# Patient Record
Sex: Female | Born: 1965 | Race: White | Hispanic: No | Marital: Married
Health system: Southern US, Community
[De-identification: ages and names within clinical notes are randomized; demographics above are authoritative.]

---

## 1998-03-07 ENCOUNTER — Emergency Department (HOSPITAL_COMMUNITY): Admission: EM | Admit: 1998-03-07 | Discharge: 1998-03-07 | Payer: Self-pay | Admitting: Emergency Medicine

## 1998-03-08 ENCOUNTER — Ambulatory Visit (HOSPITAL_COMMUNITY): Admission: RE | Admit: 1998-03-08 | Discharge: 1998-03-08 | Payer: Self-pay | Admitting: Emergency Medicine

## 1998-03-18 ENCOUNTER — Inpatient Hospital Stay (HOSPITAL_COMMUNITY): Admission: RE | Admit: 1998-03-18 | Discharge: 1998-03-19 | Payer: Self-pay

## 1999-03-20 ENCOUNTER — Inpatient Hospital Stay (HOSPITAL_COMMUNITY): Admission: EM | Admit: 1999-03-20 | Discharge: 1999-03-26 | Payer: Self-pay | Admitting: Emergency Medicine

## 1999-11-09 ENCOUNTER — Other Ambulatory Visit: Admission: RE | Admit: 1999-11-09 | Discharge: 1999-11-28 | Payer: Self-pay | Admitting: *Deleted

## 1999-12-25 ENCOUNTER — Other Ambulatory Visit (HOSPITAL_COMMUNITY): Admission: RE | Admit: 1999-12-25 | Discharge: 2000-01-03 | Payer: Self-pay | Admitting: Psychiatry

## 2000-01-22 ENCOUNTER — Inpatient Hospital Stay (HOSPITAL_COMMUNITY): Admission: EM | Admit: 2000-01-22 | Discharge: 2000-01-23 | Payer: Self-pay | Admitting: Psychiatry

## 2000-06-24 ENCOUNTER — Other Ambulatory Visit: Admission: RE | Admit: 2000-06-24 | Discharge: 2000-06-24 | Payer: Self-pay | Admitting: Gynecology

## 2000-07-03 ENCOUNTER — Encounter: Admission: RE | Admit: 2000-07-03 | Discharge: 2000-07-03 | Payer: Self-pay | Admitting: Gynecology

## 2000-07-03 ENCOUNTER — Encounter: Payer: Self-pay | Admitting: Gynecology

## 2001-08-21 ENCOUNTER — Encounter: Admission: RE | Admit: 2001-08-21 | Discharge: 2001-11-19 | Payer: Self-pay

## 2001-11-26 ENCOUNTER — Encounter: Admission: RE | Admit: 2001-11-26 | Discharge: 2002-02-24 | Payer: Self-pay

## 2002-06-15 ENCOUNTER — Emergency Department (HOSPITAL_COMMUNITY): Admission: EM | Admit: 2002-06-15 | Discharge: 2002-06-15 | Payer: Self-pay | Admitting: Emergency Medicine

## 2002-09-22 ENCOUNTER — Emergency Department (HOSPITAL_COMMUNITY): Admission: EM | Admit: 2002-09-22 | Discharge: 2002-09-23 | Payer: Self-pay

## 2002-09-22 ENCOUNTER — Encounter: Payer: Self-pay | Admitting: *Deleted

## 2003-06-19 ENCOUNTER — Encounter: Payer: Self-pay | Admitting: Emergency Medicine

## 2003-06-19 ENCOUNTER — Emergency Department (HOSPITAL_COMMUNITY): Admission: EM | Admit: 2003-06-19 | Discharge: 2003-06-19 | Payer: Self-pay | Admitting: Emergency Medicine

## 2003-07-20 ENCOUNTER — Ambulatory Visit (HOSPITAL_COMMUNITY): Admission: RE | Admit: 2003-07-20 | Discharge: 2003-07-20 | Payer: Self-pay | Admitting: Internal Medicine

## 2004-03-05 ENCOUNTER — Emergency Department (HOSPITAL_COMMUNITY): Admission: EM | Admit: 2004-03-05 | Discharge: 2004-03-05 | Payer: Self-pay | Admitting: Emergency Medicine

## 2004-08-20 ENCOUNTER — Ambulatory Visit: Payer: Self-pay | Admitting: Internal Medicine

## 2004-11-08 ENCOUNTER — Inpatient Hospital Stay (HOSPITAL_COMMUNITY): Admission: EM | Admit: 2004-11-08 | Discharge: 2004-11-09 | Payer: Self-pay | Admitting: Emergency Medicine

## 2004-11-08 ENCOUNTER — Ambulatory Visit: Payer: Self-pay | Admitting: Internal Medicine

## 2004-11-14 ENCOUNTER — Ambulatory Visit: Payer: Self-pay | Admitting: Internal Medicine

## 2005-03-12 ENCOUNTER — Emergency Department: Payer: Self-pay | Admitting: Emergency Medicine

## 2005-09-17 ENCOUNTER — Emergency Department (HOSPITAL_COMMUNITY): Admission: EM | Admit: 2005-09-17 | Discharge: 2005-09-17 | Payer: Self-pay | Admitting: Emergency Medicine

## 2007-02-18 ENCOUNTER — Encounter: Admission: RE | Admit: 2007-02-18 | Discharge: 2007-02-18 | Payer: Self-pay | Admitting: Orthopedic Surgery

## 2007-02-20 ENCOUNTER — Encounter (INDEPENDENT_AMBULATORY_CARE_PROVIDER_SITE_OTHER): Payer: Self-pay | Admitting: Orthopedic Surgery

## 2007-02-20 ENCOUNTER — Inpatient Hospital Stay (HOSPITAL_COMMUNITY): Admission: RE | Admit: 2007-02-20 | Discharge: 2007-02-21 | Payer: Self-pay | Admitting: Orthopedic Surgery

## 2010-12-24 ENCOUNTER — Emergency Department: Payer: Self-pay | Admitting: Internal Medicine

## 2011-01-22 NOTE — Discharge Summary (Signed)
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  The day of admission, she was taken to the  operating room where an anterior decompression and fusion was carried  out without events.  Interbody cage with left anterior iliac crest  graft, titanium plate and screws were used.   This morning, the first postoperative day, I removed the Penrose drain.  The incision looks fine.  The iliac crest dressing on the left side is  dry.  The patient is ambulatory.  She has a good grip bilaterally.  She  has good lower extremity strength.  She has minimal hoarseness and a  sore throat.   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   DISCUSSION:  At the present time, she is being discharged.  She will be  followed in the office in about 10-12 days.  She is to remove her iliac  crest dressing in 2 days and she may shower after that and leave the  dressings off.  She is wanting to be discharged with a soft collar, and  will get an Aspen collar on Monday, the 16th of June.   Her left brachialgia has resolved at this time.  She has been cautioned  to take a soft diet for 1 week.  She has Norco 10 at home, which she  will use for pain control.   She is considered stable with improved left brachialgia at the present  time.      Nelda Severe, MD  Electronically Signed     MT/MEDQ  D:  02/21/2007  T:  02/21/2007  Job:  [id number]

## 2011-01-22 NOTE — Op Note (Signed)
NAME:  Marilyn Lambert, HUNDAL NO.:  [id number]   MEDICAL RECORD NO.:  [id number]          PATIENT TYPE:  INP   LOCATION:  5025                         FACILITY:  [facility]   PHYSICIAN:  Nelda Severe, MD      DATE OF BIRTH:  03-Nov-1965   DATE OF PROCEDURE:  02/20/2007  DATE OF DISCHARGE:                               OPERATIVE REPORT   SURGEON:  Dr. Nelda Severe.   ASSISTANT:  Lianne Cure, PA-C   PREOPERATIVE DIAGNOSIS:  A C5-C6 disc herniation with biforaminal  stenosis status post remote C6-C7 fusion.   POSTOPERATIVE DIAGNOSIS:  A C5-C6 disc herniation with biforaminal  stenosis status post remote C6-C7 fusion.   INDICATIONS FOR SURGERY:  Chronic left brachialgia.   OPERATIVE NOTE:  The patient was placed under general endotracheal  anesthesia.  Sequential compression devices were placed on both lower  extremities.  A Foley catheter was not placed in the bladder.  She was  positioned supine on the operating table with a bump under the shoulders  so that the neck could be extended.  The head was supported on foam.  The arms were tucked at the sides.  The hips and knees were gently  flexed.  The proposed incision site at the left iliac crest for graft  harvest was marked with a skin marker.  The previous C6-C7 anterior  cervical fusion was marked with a skin marker.  A needle was taped to  the skin on the left side at a level above the previous incision, and a  cross-table lateral radiograph taken which showed the needle to be on  the skin at about the C4-C5 level.  The position of the needle was  marked with a skin marker also.  The needle was removed.  Next the  anterior cervical area and left anterior iliac crest area were prepped  with DuraPrep.  They were draped with square towels, and the towels  secured with Ioban.  Then a sheet was used to drape the incisions out.   A transverse incision was made from the midline laterally to the  anterior border of  sternocleidomastoid at a point between the position  of the previous incision and that where the needle had been taped on the  skin.  The deep fascial interval between the sternocleidomastoid and the  strap muscles was developed bluntly.  The carotid sheath was retracted  laterally, and the esophagus and trachea retracted to the right side.  The longus coli muscles were identified.  A cross-table lateral  radiograph was taken with a needle placed in the C5-C6 disc space to  confirm level.  The longus coli muscles were then mobilized bilaterally.  A Shadow-Line retractor was then placed and retracted side to side.  We  then placed an Aesculap Caspar distraction pin in C5 above and C6 below  and applied the distractor.  The C5-C6 disc was incised, and then  curettes and pituitary rongeurs were used to enucleate it.  A further  curettage removed cartilage from the endplates.  A little more  distraction was applied.  It was not necessary to  use a high-speed bur.  On the left side, there was a breech in the posterior longitudinal  ligament where the disc had apparently extruded beyond the confines of  the posterior longitudinal ligament.  In the end, we decompressed all  the way across the canal to the dura.  Disc was removed from as far  laterally as possible, and both foramina palpated with a nerve hook and  felt to be well decompressed.   We then used a sizer to determine the thickness of interbody fusion  device (Theken) that we would use.  This was the thinnest, 6 mm.   Next we harvested a left iliac crest bone graft through a short incision  made over the iliac crest about 2 inches posterior to the anterior  superior spine.  The fascia was cleared off the top of the iliac crest  and cortical bone removed with a Leksell rongeur.  A disposable bone  harvesting device was then used to harvest several small cores of bone,  which were sufficient to fill the chambers in the interbody fusion   device.   The interbody fusion device was then placed between the vertebral  bodies, and the distraction device was removed.  An appropriate-length  titanium plate was then attached anteriorly at C5-C6 with four screws.  A cross-table lateral radiograph was taken, and the radiopaque marker in  the interbody fusion device was at the posterior edge of the vertebral  bodies.  I felt that a more ideal position would be for it to be a  little more anteriorly.  Therefore the plate was removed.  Some of the  inferior lip anteriorly at C5 was removed with a high-speed bur and a  small osteotome, and the device pulled anteriorly 1 or 2 mm.  The plate  was then reattached and another x-ray taken which showed absolutely no  change in position of the cage.  I did not feel that the position was  unacceptable, and I did not want to go to a smaller footprint cage which  would contain less graft.  Therefore no further adjustment was made.   A 1/4-inch Penrose drain was placed in the depths of the anterior  cervical wound after irrigating the wound with sterile saline.  The  platysma was closed using interrupted 2-0 undyed Vicryl suture.  The  Penrose drain was secured with one 2-0 nylon suture.  The skin was  closed using 3-0 undyed Vicryl in a subcuticular fashion.  The skin  edges were reapposed with Steri-Strips.   In the iliac crest wound, we had injected FloSeal into the defect to  stop bleeding, and there was no bleeding towards the end of the  procedure from the iliac crest wound.  The external oblique fascia was  reapposed with a single figure-of-eight #0 Vicryl suture.  The  subcutaneous tissue was closed using 2-0 Vicryl suture, and the skin was  closed using 3-0 undyed Vicryl in a subcuticular fashion.  The skin  edges of both wounds were reinforced with Steri-Strips.  Nonadherent  antibiotic dressing was applied to the iliac crest wound.  A gauze  dressing was applied and secured with a  towel to the cervical spine  dressing.  The patient was taken off the table, awakened, and taken to  the recovery room.  In the recovery room, she could move all extremities  and had satisfactory grip strength bilaterally.   There were no intraoperative complications.  The blood loss was  negligible.  Sponge and needle counts were correct.      Nelda Severe, MD  Electronically Signed     MT/MEDQ  D:  02/21/2007  T:  02/22/2007  Job:  [id number]
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
## 2011-01-25 NOTE — Consult Note (Signed)
[facility]  Patient:    Marilyn Lambert, VILLAMAR Visit Number: [id number] MRN: [id number]          Service Type: Attending:  Sondra Come, D.O. Dictated by:   Sondra Come, D.O. Proc. Date: 02/12/02                            Consultation Report  Ms. Nickell returns to clinic today for reevaluation.  She was last seen on December 03, 2001 for degenerative disk disease of the cervical spine with persistent left upper extremity radicular symptoms status post C6-7 fusion.  I started her on methadone 5 mg t.i.d. at that time.  She had called our clinic following that stated that the 5 mg methadone was not helping.  I had given instructions to have her increase to two 5 mg pills b.i.d. which she states did not seem to help her pain significantly, but caused her some nausea and she was unable to tolerate the methadone.  She did not come back to clinic for reevaluation at her scheduled time secondary to financial reasons.  She returns today complaining of severe pain in her neck and left upper back and left upper extremity.  She states that approximately two weeks ago she turned her neck and heard a pop in her neck like she did prior to her first disk herniation and feels grinding in her neck with increased pain in her left upper extremity involving her posterior arm, entire forearm with associated numbness and paraesthesias in her left small finger which remain constant. She also describes dropping objects.  I review the health and history form and 14 point review of systems.  Patients pain today is an 8/10 on a subjective scale.  She describes this as achy, throbbing, sharp, stabbing with associated numbness, tingling, and weakness.  She has difficulty sleeping.  She is not currently taking any medications primarily secondary to financial reasons as the patient has lost her medical insurance because her husband was fired from his job and they are getting  divorced.  PHYSICAL EXAMINATION  GENERAL:  Healthy female in no acute distress.  VITAL SIGNS:  Blood pressure 133/78, pulse 108, respirations 16, O2 saturation 98% on room air.  NEUROLOGIC:  Manual muscle testing is 5/5 bilateral upper extremities statically.  Sensory examination reveals decreased light touch in the left small finger.  Muscle stretch reflexes are symmetric bilaterally in the upper extremities.  Upper limb tension test is equivocal on the left.  Spurling is equivocal on the left, but causes a stabbing sensation in the patients left upper back.  IMPRESSION: 1. Cervicalgia with left upper extremity radicular symptoms. 2. Degenerative disk disease of the cervical spine, status post C6-7 fusion. 3. Myofascial component. 4. Nonrestorative sleep disorder.  PLAN: 1. Will start zonegran 100 mg q.d. #14 samples given. 2. Will start Celebrex 200 mg one p.o. b.i.d. #30 samples given.  Also,    supplied patient with Celebrex pain pack to start with. 3. Will give samples of Ultracet one to two up to q.i.d. p.r.n. for pain #20    supplied. 4. Patient to return to clinic in two weeks for reevaluation.  If symptoms are    not improving, will consider increasing zonegran to 200 mg daily.  Will    also consider repeat trigger point injections to the left upper back and    parascapular muscles which were beneficial in the past. 5. If symptoms are not  improving with conservative measures, consider repeat    MRI of the cervical spine.  Patient was educated on the above findings and recommendations and understands.  There were no barriers to communication. Dictated by:   Sondra Come, D.O. Attending:  Sondra Come, D.O. DD:  02/12/02 TD:  02/15/02 Job: [id number]

## 2011-04-15 ENCOUNTER — Emergency Department: Payer: Self-pay | Admitting: Emergency Medicine

## 2011-06-27 LAB — URINALYSIS, ROUTINE W REFLEX MICROSCOPIC
Glucose, UA: NEGATIVE
Leukocytes, UA: NEGATIVE
Nitrite: NEGATIVE
Specific Gravity, Urine: 1.029 (ref 1.005–1.035)
pH: 5.5

## 2011-06-27 LAB — BASIC METABOLIC PANEL
CO2: 27
Chloride: 109
GFR calc Af Amer: >60
Potassium: 3.7
Sodium: 140

## 2011-06-27 LAB — COMPREHENSIVE METABOLIC PANEL
AST: 22
Albumin: 3.8
Alkaline Phosphatase: 75
BUN: 4 — ABNORMAL LOW
Chloride: 101
GFR calc Af Amer: >60
Potassium: 4
Sodium: 136
Total Protein: 7.1

## 2011-06-27 LAB — DIFFERENTIAL
Basophils Relative: 0
Eosinophils Relative: 1
Monocytes Absolute: 0.9 — ABNORMAL HIGH
Monocytes Relative: 9
Neutro Abs: 5.3

## 2011-06-27 LAB — CBC
HCT: 35.2 — ABNORMAL LOW
HCT: 40.7
Hemoglobin: 11.9 — ABNORMAL LOW
MCHC: 33.8
MCV: 92
Platelets: 408 — ABNORMAL HIGH
RBC: 3.83 — ABNORMAL LOW
RDW: 12.2
WBC: 9.4

## 2011-06-27 LAB — URINE CULTURE

## 2011-06-27 LAB — APTT: aPTT: 36

## 2011-07-02 ENCOUNTER — Emergency Department (HOSPITAL_COMMUNITY)
Admission: EM | Admit: 2011-07-02 | Discharge: 2011-07-03 | Disposition: A | Discharge status: Home / Self Care | Payer: Medicare Other | Attending: Emergency Medicine | Admitting: Emergency Medicine

## 2011-07-02 ENCOUNTER — Emergency Department (HOSPITAL_COMMUNITY): Payer: Medicare Other

## 2011-07-02 DIAGNOSIS — M25529 Pain in unspecified elbow: Secondary | ICD-10-CM | POA: Insufficient documentation

## 2011-07-02 DIAGNOSIS — T43502A Poisoning by unspecified antipsychotics and neuroleptics, intentional self-harm, initial encounter: Secondary | ICD-10-CM | POA: Insufficient documentation

## 2011-07-02 DIAGNOSIS — T424X4A Poisoning by benzodiazepines, undetermined, initial encounter: Secondary | ICD-10-CM | POA: Insufficient documentation

## 2011-07-02 DIAGNOSIS — F319 Bipolar disorder, unspecified: Secondary | ICD-10-CM | POA: Insufficient documentation

## 2011-07-02 DIAGNOSIS — T5191XA Toxic effect of unspecified alcohol, accidental (unintentional), initial encounter: Secondary | ICD-10-CM | POA: Insufficient documentation

## 2011-07-02 DIAGNOSIS — I951 Orthostatic hypotension: Secondary | ICD-10-CM | POA: Insufficient documentation

## 2011-07-02 DIAGNOSIS — F411 Generalized anxiety disorder: Secondary | ICD-10-CM | POA: Insufficient documentation

## 2011-07-02 DIAGNOSIS — T426X1A Poisoning by other antiepileptic and sedative-hypnotic drugs, accidental (unintentional), initial encounter: Secondary | ICD-10-CM | POA: Insufficient documentation

## 2011-07-02 DIAGNOSIS — T4272XA Poisoning by unspecified antiepileptic and sedative-hypnotic drugs, intentional self-harm, initial encounter: Secondary | ICD-10-CM | POA: Insufficient documentation

## 2011-07-02 DIAGNOSIS — T426X2A Poisoning by other antiepileptic and sedative-hypnotic drugs, intentional self-harm, initial encounter: Secondary | ICD-10-CM | POA: Insufficient documentation

## 2011-07-02 DIAGNOSIS — T6592XA Toxic effect of unspecified substance, intentional self-harm, initial encounter: Secondary | ICD-10-CM | POA: Insufficient documentation

## 2011-07-02 LAB — CBC
MCH: 31.8 pg (ref 26.0–34.0)
MCV: 91.8 fL (ref 78.0–100.0)
Platelets: 418 10*3/uL — ABNORMAL HIGH (ref 150–400)
RBC: 4.25 MIL/uL (ref 3.87–5.11)
RDW: 13 % (ref 11.5–15.5)

## 2011-07-02 LAB — ETHANOL: Alcohol, Ethyl (B): 94 mg/dL — ABNORMAL HIGH (ref 0–11)

## 2011-07-02 LAB — BASIC METABOLIC PANEL
CO2: 22 mEq/L (ref 19–32)
Calcium: 9.2 mg/dL (ref 8.4–10.5)
Sodium: 144 mEq/L (ref 135–145)

## 2011-07-02 LAB — RAPID URINE DRUG SCREEN, HOSP PERFORMED: Amphetamines: NOT DETECTED

## 2011-07-02 LAB — ACETAMINOPHEN LEVEL: Acetaminophen (Tylenol), Serum: <15 ug/mL (ref 10–30)

## 2011-07-02 LAB — SALICYLATE LEVEL: Salicylate Lvl: <2 mg/dL — ABNORMAL LOW (ref 2.8–20.0)

## 2014-12-21 ENCOUNTER — Other Ambulatory Visit (HOSPITAL_COMMUNITY): Payer: Self-pay | Admitting: Neurosurgery

## 2014-12-21 DIAGNOSIS — M4726 Other spondylosis with radiculopathy, lumbar region: Secondary | ICD-10-CM

## 2014-12-21 DIAGNOSIS — M4722 Other spondylosis with radiculopathy, cervical region: Secondary | ICD-10-CM

## 2014-12-28 ENCOUNTER — Ambulatory Visit (HOSPITAL_COMMUNITY)
Admission: RE | Admit: 2014-12-28 | Discharge: 2014-12-28 | Disposition: A | Discharge status: Home / Self Care | Payer: Medicare Other | Source: Ambulatory Visit | Attending: Neurosurgery | Admitting: Neurosurgery

## 2014-12-28 DIAGNOSIS — Z981 Arthrodesis status: Secondary | ICD-10-CM | POA: Insufficient documentation

## 2014-12-28 DIAGNOSIS — M4722 Other spondylosis with radiculopathy, cervical region: Secondary | ICD-10-CM

## 2014-12-28 DIAGNOSIS — I700 Atherosclerosis of aorta: Secondary | ICD-10-CM | POA: Insufficient documentation

## 2014-12-28 DIAGNOSIS — M5126 Other intervertebral disc displacement, lumbar region: Secondary | ICD-10-CM | POA: Insufficient documentation

## 2014-12-28 DIAGNOSIS — M4726 Other spondylosis with radiculopathy, lumbar region: Secondary | ICD-10-CM | POA: Diagnosis not present

## 2014-12-28 MED ORDER — OXYCODONE HCL 5 MG PO TABS
ORAL_TABLET | ORAL | Status: AC
  Filled 2014-12-28: qty 1

## 2014-12-28 MED ORDER — OXYCODONE-ACETAMINOPHEN 5-325 MG PO TABS
ORAL_TABLET | ORAL | Status: AC
  Administered 2014-12-28: 2
  Filled 2014-12-28: qty 2

## 2014-12-28 MED ORDER — ONDANSETRON HCL 4 MG/2ML IJ SOLN: 4.0000 mg | Freq: Four times a day (QID) | INTRAMUSCULAR | Status: DC | PRN

## 2014-12-28 MED ORDER — DIAZEPAM 5 MG PO TABS
ORAL_TABLET | ORAL | Status: AC
  Filled 2014-12-28: qty 2

## 2014-12-28 MED ORDER — DIAZEPAM 5 MG PO TABS
10.0000 mg | ORAL_TABLET | Freq: Once | ORAL | Status: AC
  Administered 2014-12-28: 10 mg via ORAL

## 2014-12-28 MED ORDER — OXYCODONE HCL 5 MG PO TABS
5.0000 mg | ORAL_TABLET | ORAL | Status: DC | PRN
  Administered 2014-12-28: 5 mg via ORAL

## 2014-12-28 MED ORDER — IOHEXOL 300 MG/ML  SOLN
10.0000 mL | Freq: Once | INTRAMUSCULAR | Status: AC | PRN
  Administered 2014-12-28: 10 mL via INTRATHECAL

## 2014-12-28 NOTE — Op Note (Signed)
12/28/2014 Cervical and Lumbar Myelogram  PATIENT:  Marilyn Lambert is a 49 y.o. female with radicular pain in the upper extremities, and lower extremities  PRE-OPERATIVE DIAGNOSIS:  osteoarthritis cervical and lumbar with radiculopathies  POST-OPERATIVE DIAGNOSIS:  same  PROCEDURE:  Lumbar Myelogram  SURGEON:  Anthea Udovich  ANESTHESIA:   local LOCAL MEDICATIONS USED:  LIDOCAINE  and Amount: 5 ml Procedure Note: Marilyn Lambert is a 49 y.o. female Was taken to the fluoroscopy suite and  positioned prone on the fluoroscopy table. Her back was prepared and draped in a sterile manner. I infiltrated 5 cc into the lumbar region. I then introduced a spinal needle into the thecal sac at the L3/4 interlaminar space. I infiltrated 10cc of Omnipaque 300 into the thecal sac. Fluoroscopy showed the needle and contrast in the thecal sac. Marilyn Lambert tolerated the procedure well. she Will be taken to CT for evaluation.     PATIENT DISPOSITION:  PACU - hemodynamically stable.

## 2014-12-28 NOTE — Discharge Instructions (Signed)
Myelogram and Lumbar Puncture Discharge Instructions ° °1. Go home and rest quietly for the next 24 hours.  It is important to lie flat for the next 24 hours.  Get up only to go to the restroom.  You may lie in the bed or on a couch on your back, your stomach, your left side or your right side.  You may have one pillow under your head.  You may have pillows between your knees while you are on your side or under your knees while you are on your back. ° °2. DO NOT drive today.  Recline the seat as far back as it will go, while still wearing your seat belt, on the way home. ° °3. You may get up to go to the bathroom as needed.  You may sit up for 10 minutes to eat.  You may resume your normal diet and medications unless otherwise indicated. ° °4. The incidence of headache, nausea, or vomiting is about 5% (one in 20 patients).  If you develop a headache, lie flat and drink plenty of fluids until the headache goes away.  Caffeinated beverages may be helpful.  If you develop severe nausea and vomiting or a headache that does not go away with flat bed rest, call Dr Cabbell. ° °5. You may resume normal activities after your 24 hours of bed rest is over; however, do not exert yourself strongly or do any heavy lifting tomorrow. ° °6. Call your physician for a follow-up appointment.  The results of your myelogram will be sent directly to your physician by the following day. ° °7. If you have any questions or if complications develop after you arrive home, please call Dr Cabbell. ° °Discharge instructions have been explained to the patient.  The patient, or the person responsible for the patient, fully understands these instructions. ° ° °

## 2015-05-28 IMAGING — CT CT L SPINE W/ CM
3 of 10 series · 11 of 33 positions shown, 14 images · non-contrast
Comparison: none

CLINICAL DATA: Neck pain and back pain of unspecified duration. No
reported lower extremity radicular symptoms. Weakness and numbness
extending to the LEFT arm and hand.
TECHNIQUE: Contiguous axial images were obtained through the Cervical,
Thoracic, and Lumbar spine after the intrathecal infusion of
infusion. Coronal and sagittal reconstructions were obtained of the
axial image sets.

[Series 6: l spine 2.0 i30s 3 · axial · 0.33mm/px · z∈[-785,-609]mm · 5 of 124 slices shown, 7 images]
[im 18/124  soft-tissue]
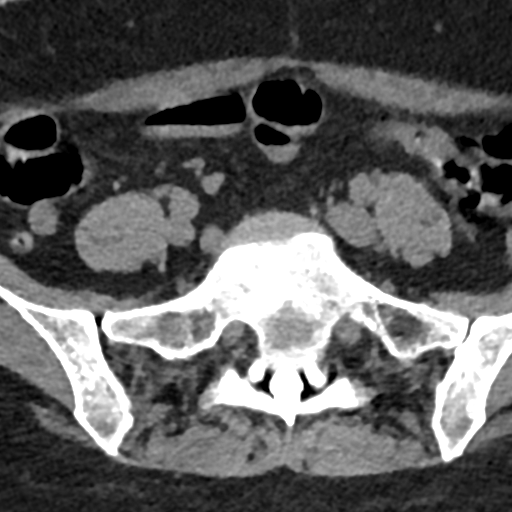
[im 18/124  bone]
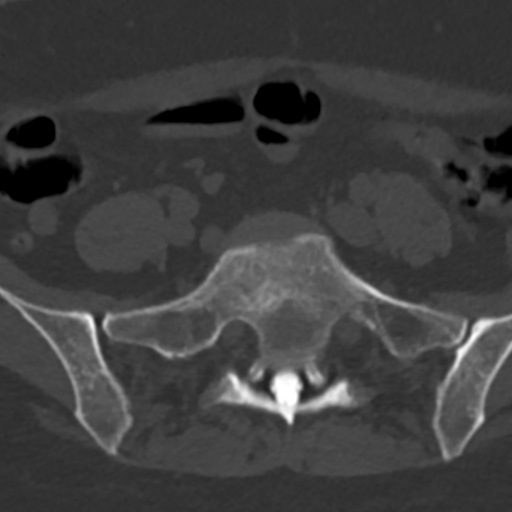
[im 36/124  bone]
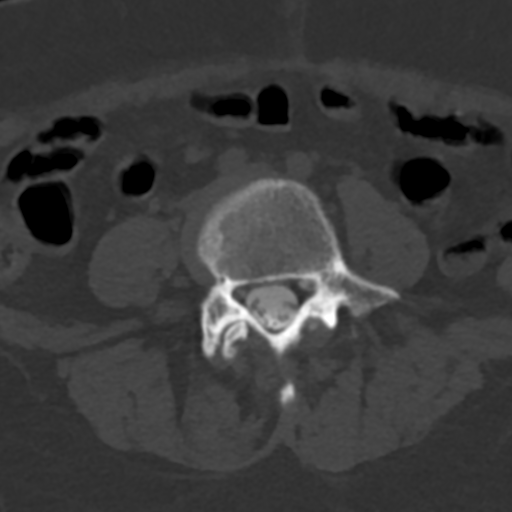
[im 71/124  bone]
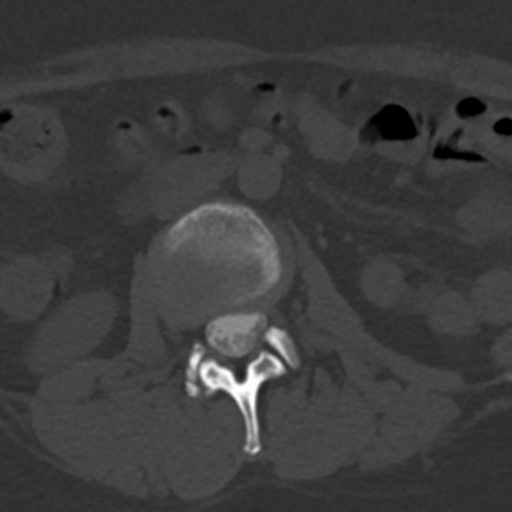
[im 88/124  bone]
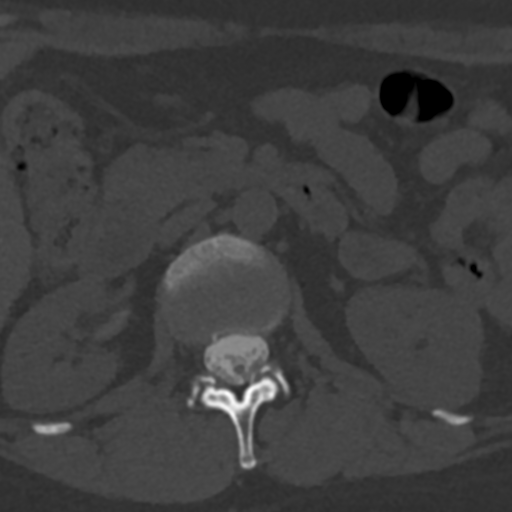
[im 106/124  soft-tissue]
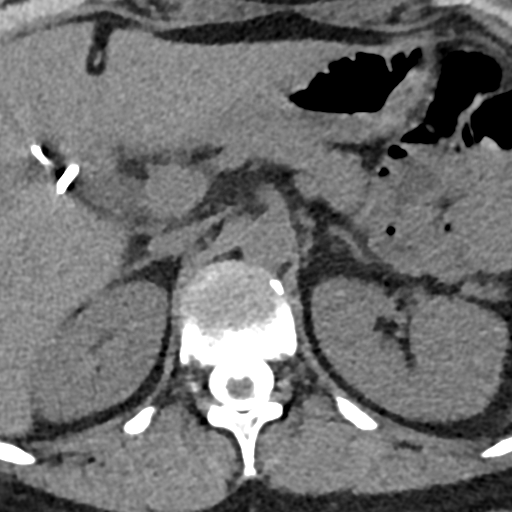
[im 106/124  bone]
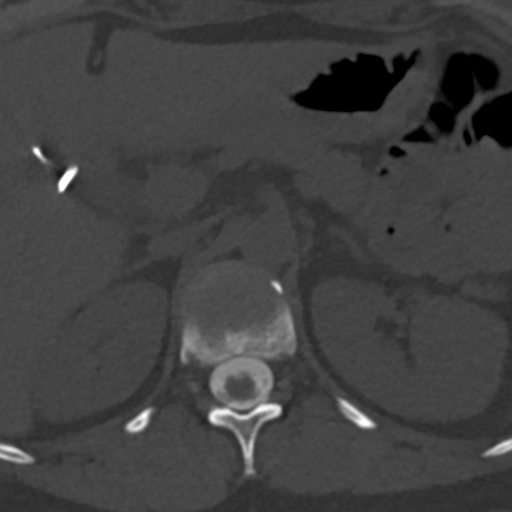

[Series 9: coronal st · coronal · 0.33mm/px · 1 of 83 slices shown]
[im 42/83  bone]
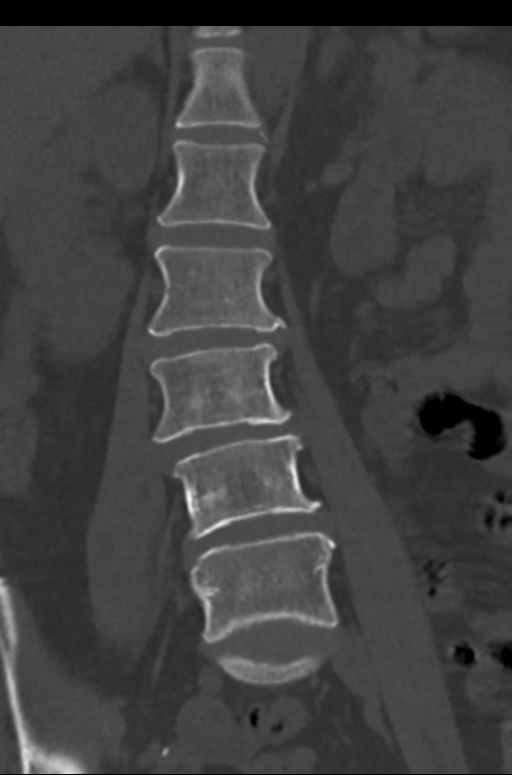

[Series 10: sagittal st · sagittal · 0.33mm/px · 5 of 72 slices shown, 6 images]
[im 24/72  bone]
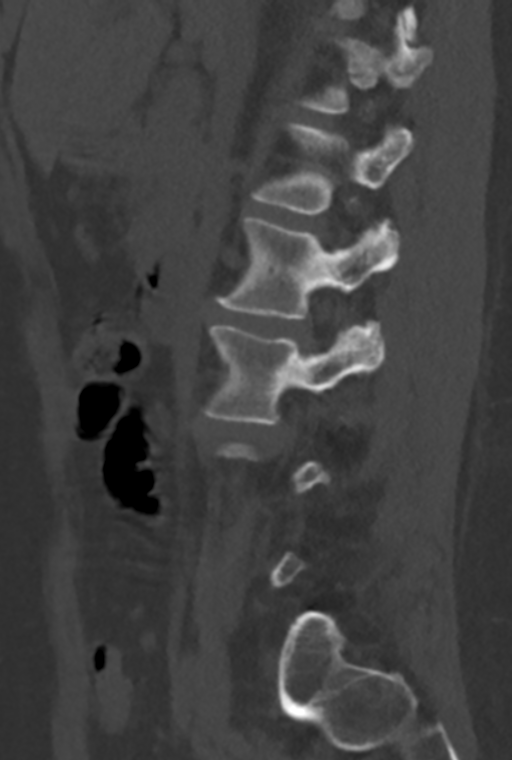
[im 30/72  bone]
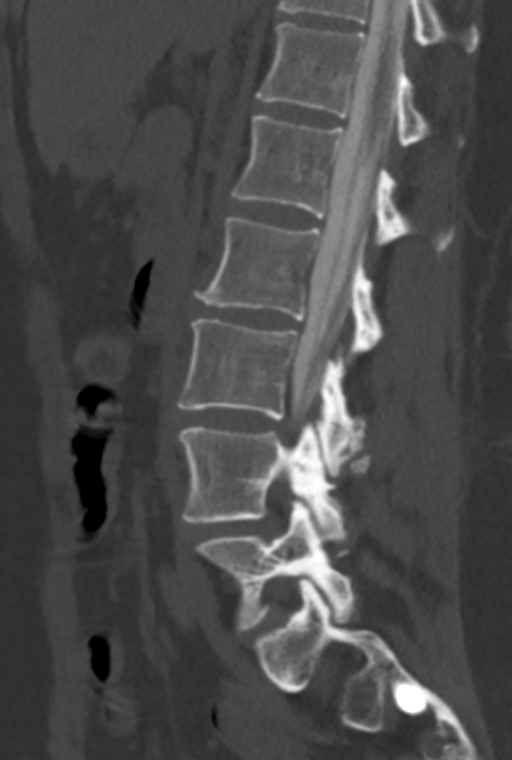
[im 36/72  soft-tissue]
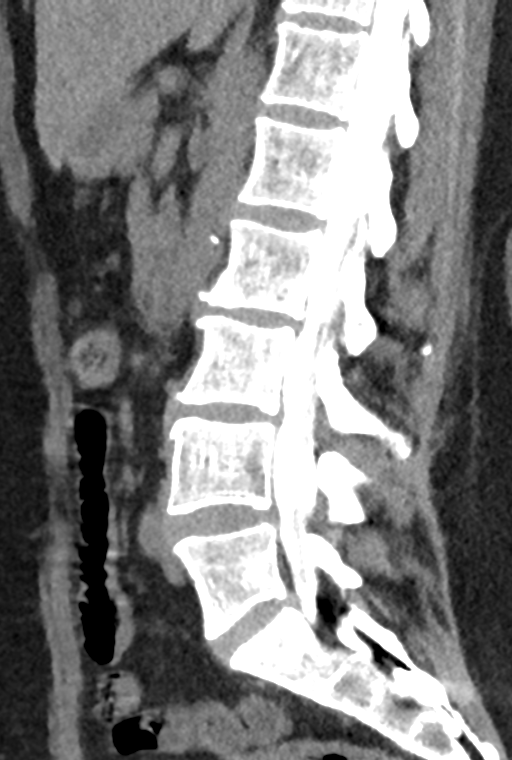
[im 36/72  bone]
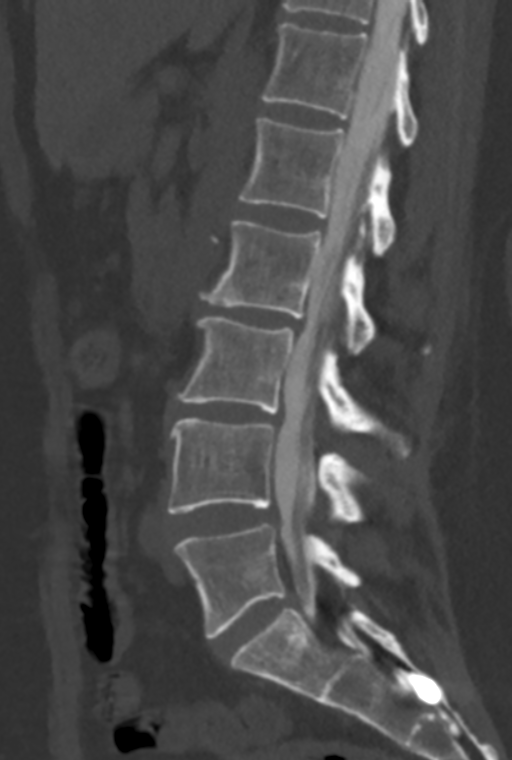
[im 42/72  bone]
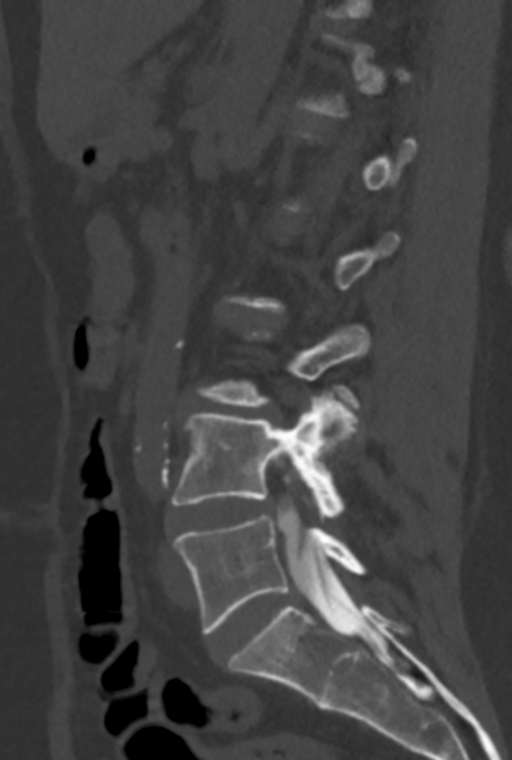
[im 48/72  bone]
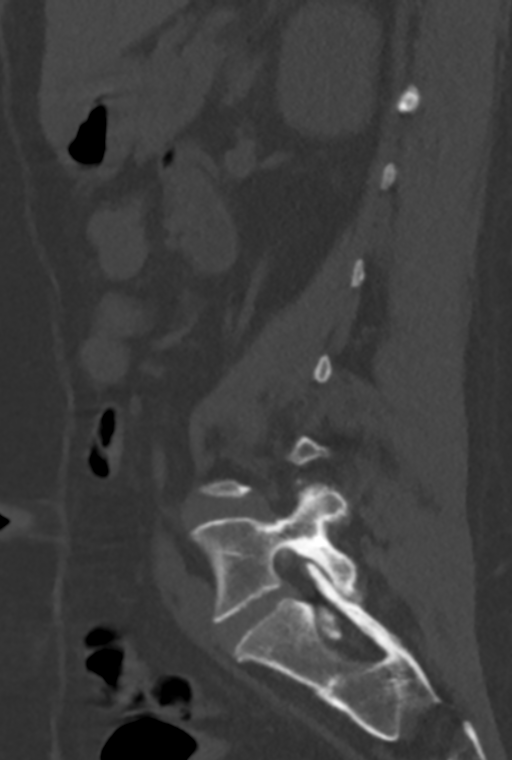

[11 of 33 positions shown; findings below may reference images not displayed]

FLUOROSCOPY TIME:  1 minutes and 36 seconds.

PROCEDURE:
LUMBAR PUNCTURE FOR CERVICAL LUMBAR  MYELOGRAM

CERVICAL AND LUMBAR  MYELOGRAM

CT CERVICAL MYELOGRAM

CT LUMBAR MYELOGRAM

Informed consent was obtained by the ordering provider Neurosurgeon
who also performed the lumbar puncture.

10 mL 0mnipaque-PLL was injected into the thecal sac, with normal
opacification of the nerve roots and cauda equina consistent with
free flow within the subarachnoid space. The patient was then moved
to the trendelenburg position and contrast flowed into the Thoracic
and Cervical spine regions.

I personally supervised acquisition of the myelogram images.
FINDINGS: CERVICAL AND LUMBAR MYELOGRAM FINDINGS:

Lumbar: Good opacification lumbar subarachnoid space. No nerve root
cut off or spinal stenosis. Minor lumbar spondylosis contributes to
slight scoliosis convex RIGHT with asymmetric disc space narrowing
at L2-3 on the LEFT. Normal-appearing conus. No intradural mass
lesion.

Cervical: Good opacification of the cervical subarachnoid space.
Previous C5-6 interbody fusion with anterior plating.
Non-instrumented C6-7 fusion. No residual significant stenosis or
impingement at this level. No solid interbody bridging at C5-6 on
lateral view. Shallow ventral defect at C5-6 related to disc
osteophyte complex.

CT CERVICAL MYELOGRAM FINDINGS:

Madelene deformity. Reversal of the normal cervical lordotic curve.

The individual disc spaces were examined as follows:

C2-3: Trace anterolisthesis related to severe LEFT-sided facet
arthropathy. Asymmetric foraminal narrowing. LEFT C3 nerve root
impingement is likely.

C3-4:  Unremarkable.  Mild facet arthropathy is noncompressive.

C4-5: Slight annular bulging. Mild facet arthropathy without
compression.

C5-6: Pseudarthrosis. No solid interbody bridging. Subsidence of the
interbody cage. No definite screw loosening, but the LEFT C5 screw
may be fractured. Residual LEFT-sided uncinate spurring affects the
LEFT C6 nerve.

C6-7:  Solid non-instrumented fusion.  No impingement.

C7-T1:  Unremarkable disc space.  BILATERAL facet arthropathy.

No neck masses.  Lung apices clear.

CT LUMBAR MYELOGRAM FINDINGS:

No osseous findings. Mild non aneurysmal atherosclerotic change of
the aorta.

L1-L2:  Mild facet arthropathy.  No disc protrusion.

L2-L3: Slight asymmetric loss of interspace height on the LEFT.
Slight LEFT-sided spurring. No impingement.

L3-L4: Slight LEFT-sided spurring. Far lateral and foraminal
protrusion on the LEFT. Asymmetric facet arthropathy. LEFT L3 and
LEFT L4 nerve root impingement are possible.

L4-L5:  Mild facet arthropathy.  No impingement.

L5-S1:  Mild facet arthropathy.  No impingement.
IMPRESSION: Prior two level cervical fusion C5-C7, most recently 7567.
Pseudarthrosis C5-6. Probable LEFT C5 screw fracture.

Residual foraminal narrowing at C5-6 due to uncinate spurring. LEFT
C6 nerve root impingement is likely.

Severe LEFT-sided facet arthropathy at C2-C3 with uncinate spurring.
LEFT C3 nerve root impingement is likely.

Minor lumbar spondylosis. Far-lateral and foraminal protrusion on
the LEFT at L3-4. Correlate clinically for LEFT leg radicular
symptoms.

## 2020-11-23 DIAGNOSIS — S32009A Unspecified fracture of unspecified lumbar vertebra, initial encounter for closed fracture: Secondary | ICD-10-CM | POA: Diagnosis not present

## 2020-11-23 DIAGNOSIS — Z978 Presence of other specified devices: Secondary | ICD-10-CM | POA: Diagnosis not present

## 2020-11-23 DIAGNOSIS — R519 Headache, unspecified: Secondary | ICD-10-CM | POA: Diagnosis not present

## 2020-11-23 DIAGNOSIS — M546 Pain in thoracic spine: Secondary | ICD-10-CM | POA: Diagnosis not present

## 2020-11-23 DIAGNOSIS — S32019A Unspecified fracture of first lumbar vertebra, initial encounter for closed fracture: Secondary | ICD-10-CM | POA: Diagnosis not present

## 2020-11-23 DIAGNOSIS — M549 Dorsalgia, unspecified: Secondary | ICD-10-CM | POA: Diagnosis not present

## 2020-11-23 DIAGNOSIS — T84296A Other mechanical complication of internal fixation device of vertebrae, initial encounter: Secondary | ICD-10-CM | POA: Diagnosis not present

## 2020-11-23 DIAGNOSIS — S299XXA Unspecified injury of thorax, initial encounter: Secondary | ICD-10-CM | POA: Diagnosis not present

## 2020-11-23 DIAGNOSIS — S0990XA Unspecified injury of head, initial encounter: Secondary | ICD-10-CM | POA: Diagnosis not present

## 2020-11-23 DIAGNOSIS — Z041 Encounter for examination and observation following transport accident: Secondary | ICD-10-CM | POA: Diagnosis not present

## 2020-11-29 DIAGNOSIS — Z720 Tobacco use: Secondary | ICD-10-CM | POA: Diagnosis not present

## 2020-11-29 DIAGNOSIS — F310 Bipolar disorder, current episode hypomanic: Secondary | ICD-10-CM | POA: Diagnosis not present

## 2020-11-29 DIAGNOSIS — F1320 Sedative, hypnotic or anxiolytic dependence, uncomplicated: Secondary | ICD-10-CM | POA: Diagnosis not present

## 2020-11-29 DIAGNOSIS — F411 Generalized anxiety disorder: Secondary | ICD-10-CM | POA: Diagnosis not present

## 2021-02-02 DIAGNOSIS — F310 Bipolar disorder, current episode hypomanic: Secondary | ICD-10-CM | POA: Diagnosis not present

## 2021-02-02 DIAGNOSIS — F17200 Nicotine dependence, unspecified, uncomplicated: Secondary | ICD-10-CM | POA: Diagnosis not present

## 2021-02-02 DIAGNOSIS — F17210 Nicotine dependence, cigarettes, uncomplicated: Secondary | ICD-10-CM | POA: Diagnosis not present

## 2021-02-02 DIAGNOSIS — F1320 Sedative, hypnotic or anxiolytic dependence, uncomplicated: Secondary | ICD-10-CM | POA: Diagnosis not present

## 2021-02-02 DIAGNOSIS — F411 Generalized anxiety disorder: Secondary | ICD-10-CM | POA: Diagnosis not present

## 2021-02-02 DIAGNOSIS — Z79899 Other long term (current) drug therapy: Secondary | ICD-10-CM | POA: Diagnosis not present

## 2021-03-02 DIAGNOSIS — E7800 Pure hypercholesterolemia, unspecified: Secondary | ICD-10-CM | POA: Diagnosis not present

## 2021-03-02 DIAGNOSIS — F1320 Sedative, hypnotic or anxiolytic dependence, uncomplicated: Secondary | ICD-10-CM | POA: Diagnosis not present

## 2021-03-02 DIAGNOSIS — Z79899 Other long term (current) drug therapy: Secondary | ICD-10-CM | POA: Diagnosis not present

## 2021-03-02 DIAGNOSIS — F17200 Nicotine dependence, unspecified, uncomplicated: Secondary | ICD-10-CM | POA: Diagnosis not present

## 2021-03-02 DIAGNOSIS — F17210 Nicotine dependence, cigarettes, uncomplicated: Secondary | ICD-10-CM | POA: Diagnosis not present

## 2021-03-02 DIAGNOSIS — F411 Generalized anxiety disorder: Secondary | ICD-10-CM | POA: Diagnosis not present

## 2021-03-02 DIAGNOSIS — Z131 Encounter for screening for diabetes mellitus: Secondary | ICD-10-CM | POA: Diagnosis not present

## 2021-03-02 DIAGNOSIS — F310 Bipolar disorder, current episode hypomanic: Secondary | ICD-10-CM | POA: Diagnosis not present

## 2021-03-06 DIAGNOSIS — Z79899 Other long term (current) drug therapy: Secondary | ICD-10-CM | POA: Diagnosis not present

## 2021-04-12 DIAGNOSIS — M545 Low back pain, unspecified: Secondary | ICD-10-CM | POA: Diagnosis not present

## 2021-04-12 DIAGNOSIS — M47812 Spondylosis without myelopathy or radiculopathy, cervical region: Secondary | ICD-10-CM | POA: Diagnosis not present

## 2021-04-27 DIAGNOSIS — M542 Cervicalgia: Secondary | ICD-10-CM | POA: Diagnosis not present

## 2021-04-27 DIAGNOSIS — M47812 Spondylosis without myelopathy or radiculopathy, cervical region: Secondary | ICD-10-CM | POA: Diagnosis not present

## 2021-05-01 DIAGNOSIS — M47812 Spondylosis without myelopathy or radiculopathy, cervical region: Secondary | ICD-10-CM | POA: Diagnosis not present

## 2021-05-03 DIAGNOSIS — E559 Vitamin D deficiency, unspecified: Secondary | ICD-10-CM | POA: Diagnosis not present

## 2021-05-03 DIAGNOSIS — M542 Cervicalgia: Secondary | ICD-10-CM | POA: Diagnosis not present

## 2021-05-03 DIAGNOSIS — G8929 Other chronic pain: Secondary | ICD-10-CM | POA: Diagnosis not present

## 2021-05-03 DIAGNOSIS — M79603 Pain in arm, unspecified: Secondary | ICD-10-CM | POA: Diagnosis not present

## 2021-05-03 DIAGNOSIS — Z01818 Encounter for other preprocedural examination: Secondary | ICD-10-CM | POA: Diagnosis not present

## 2021-05-03 DIAGNOSIS — Z720 Tobacco use: Secondary | ICD-10-CM | POA: Diagnosis not present

## 2021-05-03 DIAGNOSIS — F17210 Nicotine dependence, cigarettes, uncomplicated: Secondary | ICD-10-CM | POA: Diagnosis not present

## 2021-05-03 DIAGNOSIS — Z7689 Persons encountering health services in other specified circumstances: Secondary | ICD-10-CM | POA: Diagnosis not present

## 2021-05-03 DIAGNOSIS — Z79899 Other long term (current) drug therapy: Secondary | ICD-10-CM | POA: Diagnosis not present

## 2021-05-18 DIAGNOSIS — E876 Hypokalemia: Secondary | ICD-10-CM | POA: Diagnosis not present

## 2021-05-30 DIAGNOSIS — M542 Cervicalgia: Secondary | ICD-10-CM | POA: Diagnosis not present

## 2021-05-30 DIAGNOSIS — E876 Hypokalemia: Secondary | ICD-10-CM | POA: Diagnosis not present

## 2021-05-30 DIAGNOSIS — G8929 Other chronic pain: Secondary | ICD-10-CM | POA: Diagnosis not present

## 2021-06-07 DIAGNOSIS — E876 Hypokalemia: Secondary | ICD-10-CM | POA: Diagnosis not present

## 2021-06-07 DIAGNOSIS — R000 Tachycardia, unspecified: Secondary | ICD-10-CM | POA: Diagnosis not present

## 2021-06-07 DIAGNOSIS — E785 Hyperlipidemia, unspecified: Secondary | ICD-10-CM | POA: Diagnosis not present

## 2021-06-07 DIAGNOSIS — Z7689 Persons encountering health services in other specified circumstances: Secondary | ICD-10-CM | POA: Diagnosis not present

## 2021-06-12 DIAGNOSIS — M5136 Other intervertebral disc degeneration, lumbar region: Secondary | ICD-10-CM | POA: Diagnosis not present

## 2021-06-12 DIAGNOSIS — Z79891 Long term (current) use of opiate analgesic: Secondary | ICD-10-CM | POA: Diagnosis not present

## 2021-06-12 DIAGNOSIS — M542 Cervicalgia: Secondary | ICD-10-CM | POA: Diagnosis not present

## 2021-06-12 DIAGNOSIS — M545 Low back pain, unspecified: Secondary | ICD-10-CM | POA: Diagnosis not present

## 2021-06-12 DIAGNOSIS — M961 Postlaminectomy syndrome, not elsewhere classified: Secondary | ICD-10-CM | POA: Diagnosis not present

## 2021-06-12 DIAGNOSIS — M25512 Pain in left shoulder: Secondary | ICD-10-CM | POA: Diagnosis not present

## 2021-06-12 DIAGNOSIS — G894 Chronic pain syndrome: Secondary | ICD-10-CM | POA: Diagnosis not present

## 2021-07-24 DIAGNOSIS — M5136 Other intervertebral disc degeneration, lumbar region: Secondary | ICD-10-CM | POA: Diagnosis not present

## 2021-07-24 DIAGNOSIS — M25512 Pain in left shoulder: Secondary | ICD-10-CM | POA: Diagnosis not present

## 2021-07-24 DIAGNOSIS — Z79891 Long term (current) use of opiate analgesic: Secondary | ICD-10-CM | POA: Diagnosis not present

## 2021-07-24 DIAGNOSIS — G894 Chronic pain syndrome: Secondary | ICD-10-CM | POA: Diagnosis not present

## 2021-07-24 DIAGNOSIS — M542 Cervicalgia: Secondary | ICD-10-CM | POA: Diagnosis not present

## 2021-07-24 DIAGNOSIS — M961 Postlaminectomy syndrome, not elsewhere classified: Secondary | ICD-10-CM | POA: Diagnosis not present

## 2021-07-24 DIAGNOSIS — M545 Low back pain, unspecified: Secondary | ICD-10-CM | POA: Diagnosis not present

## 2021-07-26 DIAGNOSIS — R062 Wheezing: Secondary | ICD-10-CM | POA: Diagnosis not present

## 2021-07-26 DIAGNOSIS — J209 Acute bronchitis, unspecified: Secondary | ICD-10-CM | POA: Diagnosis not present

## 2021-07-26 DIAGNOSIS — J0100 Acute maxillary sinusitis, unspecified: Secondary | ICD-10-CM | POA: Diagnosis not present

## 2021-07-26 DIAGNOSIS — R059 Cough, unspecified: Secondary | ICD-10-CM | POA: Diagnosis not present

## 2021-08-21 DIAGNOSIS — M545 Low back pain, unspecified: Secondary | ICD-10-CM | POA: Diagnosis not present

## 2021-08-21 DIAGNOSIS — M542 Cervicalgia: Secondary | ICD-10-CM | POA: Diagnosis not present

## 2021-08-21 DIAGNOSIS — Z79891 Long term (current) use of opiate analgesic: Secondary | ICD-10-CM | POA: Diagnosis not present

## 2021-08-21 DIAGNOSIS — M5136 Other intervertebral disc degeneration, lumbar region: Secondary | ICD-10-CM | POA: Diagnosis not present

## 2021-08-21 DIAGNOSIS — G894 Chronic pain syndrome: Secondary | ICD-10-CM | POA: Diagnosis not present

## 2021-08-21 DIAGNOSIS — M961 Postlaminectomy syndrome, not elsewhere classified: Secondary | ICD-10-CM | POA: Diagnosis not present

## 2021-08-21 DIAGNOSIS — M25512 Pain in left shoulder: Secondary | ICD-10-CM | POA: Diagnosis not present

## 2022-02-19 DIAGNOSIS — Z139 Encounter for screening, unspecified: Secondary | ICD-10-CM | POA: Diagnosis not present

## 2022-02-19 DIAGNOSIS — Z136 Encounter for screening for cardiovascular disorders: Secondary | ICD-10-CM | POA: Diagnosis not present

## 2022-02-19 DIAGNOSIS — F17200 Nicotine dependence, unspecified, uncomplicated: Secondary | ICD-10-CM | POA: Diagnosis not present

## 2022-02-19 DIAGNOSIS — Z1389 Encounter for screening for other disorder: Secondary | ICD-10-CM | POA: Diagnosis not present

## 2022-02-19 DIAGNOSIS — Z0000 Encounter for general adult medical examination without abnormal findings: Secondary | ICD-10-CM | POA: Diagnosis not present

## 2022-02-23 DIAGNOSIS — Z79899 Other long term (current) drug therapy: Secondary | ICD-10-CM | POA: Diagnosis not present

## 2022-02-23 DIAGNOSIS — S39012A Strain of muscle, fascia and tendon of lower back, initial encounter: Secondary | ICD-10-CM | POA: Diagnosis not present

## 2022-02-23 DIAGNOSIS — M549 Dorsalgia, unspecified: Secondary | ICD-10-CM | POA: Diagnosis not present

## 2022-02-23 DIAGNOSIS — E7800 Pure hypercholesterolemia, unspecified: Secondary | ICD-10-CM | POA: Diagnosis not present

## 2022-02-23 DIAGNOSIS — E559 Vitamin D deficiency, unspecified: Secondary | ICD-10-CM | POA: Diagnosis not present

## 2022-02-23 DIAGNOSIS — G8929 Other chronic pain: Secondary | ICD-10-CM | POA: Diagnosis not present

## 2022-02-23 DIAGNOSIS — Z0130 Encounter for examination of blood pressure without abnormal findings: Secondary | ICD-10-CM | POA: Diagnosis not present

## 2022-02-23 DIAGNOSIS — M542 Cervicalgia: Secondary | ICD-10-CM | POA: Diagnosis not present

## 2022-02-28 DIAGNOSIS — Z79899 Other long term (current) drug therapy: Secondary | ICD-10-CM | POA: Diagnosis not present

## 2022-02-28 DIAGNOSIS — R030 Elevated blood-pressure reading, without diagnosis of hypertension: Secondary | ICD-10-CM | POA: Diagnosis not present

## 2022-02-28 DIAGNOSIS — E7800 Pure hypercholesterolemia, unspecified: Secondary | ICD-10-CM | POA: Diagnosis not present

## 2022-02-28 DIAGNOSIS — E876 Hypokalemia: Secondary | ICD-10-CM | POA: Diagnosis not present

## 2022-02-28 DIAGNOSIS — E559 Vitamin D deficiency, unspecified: Secondary | ICD-10-CM | POA: Diagnosis not present

## 2022-02-28 DIAGNOSIS — Z0130 Encounter for examination of blood pressure without abnormal findings: Secondary | ICD-10-CM | POA: Diagnosis not present

## 2022-08-03 DIAGNOSIS — F17210 Nicotine dependence, cigarettes, uncomplicated: Secondary | ICD-10-CM | POA: Diagnosis not present

## 2022-08-03 DIAGNOSIS — I10 Essential (primary) hypertension: Secondary | ICD-10-CM | POA: Diagnosis not present

## 2022-08-03 DIAGNOSIS — R9431 Abnormal electrocardiogram [ECG] [EKG]: Secondary | ICD-10-CM | POA: Diagnosis not present

## 2022-08-03 DIAGNOSIS — I959 Hypotension, unspecified: Secondary | ICD-10-CM | POA: Diagnosis not present

## 2022-08-03 DIAGNOSIS — Z79899 Other long term (current) drug therapy: Secondary | ICD-10-CM | POA: Diagnosis not present

## 2022-08-03 DIAGNOSIS — Z91148 Patient's other noncompliance with medication regimen for other reason: Secondary | ICD-10-CM | POA: Diagnosis not present

## 2022-08-04 DIAGNOSIS — Z91148 Patient's other noncompliance with medication regimen for other reason: Secondary | ICD-10-CM | POA: Diagnosis not present

## 2022-08-04 DIAGNOSIS — E876 Hypokalemia: Secondary | ICD-10-CM | POA: Diagnosis not present

## 2022-08-05 DIAGNOSIS — Z91148 Patient's other noncompliance with medication regimen for other reason: Secondary | ICD-10-CM | POA: Diagnosis not present

## 2022-08-05 DIAGNOSIS — E876 Hypokalemia: Secondary | ICD-10-CM | POA: Diagnosis not present

## 2022-08-22 DIAGNOSIS — Z862 Personal history of diseases of the blood and blood-forming organs and certain disorders involving the immune mechanism: Secondary | ICD-10-CM | POA: Diagnosis not present

## 2022-08-22 DIAGNOSIS — R202 Paresthesia of skin: Secondary | ICD-10-CM | POA: Diagnosis not present

## 2022-08-22 DIAGNOSIS — Z6824 Body mass index (BMI) 24.0-24.9, adult: Secondary | ICD-10-CM | POA: Diagnosis not present

## 2022-08-22 DIAGNOSIS — Z7689 Persons encountering health services in other specified circumstances: Secondary | ICD-10-CM | POA: Diagnosis not present

## 2022-08-22 DIAGNOSIS — Z131 Encounter for screening for diabetes mellitus: Secondary | ICD-10-CM | POA: Diagnosis not present

## 2022-08-22 DIAGNOSIS — E785 Hyperlipidemia, unspecified: Secondary | ICD-10-CM | POA: Diagnosis not present

## 2022-09-05 DIAGNOSIS — M545 Low back pain, unspecified: Secondary | ICD-10-CM | POA: Diagnosis not present

## 2022-09-06 DIAGNOSIS — M542 Cervicalgia: Secondary | ICD-10-CM | POA: Diagnosis not present

## 2022-09-06 DIAGNOSIS — M961 Postlaminectomy syndrome, not elsewhere classified: Secondary | ICD-10-CM | POA: Diagnosis not present

## 2022-09-06 DIAGNOSIS — M25512 Pain in left shoulder: Secondary | ICD-10-CM | POA: Diagnosis not present

## 2022-09-06 DIAGNOSIS — Z79891 Long term (current) use of opiate analgesic: Secondary | ICD-10-CM | POA: Diagnosis not present

## 2022-09-06 DIAGNOSIS — M5136 Other intervertebral disc degeneration, lumbar region: Secondary | ICD-10-CM | POA: Diagnosis not present

## 2022-09-06 DIAGNOSIS — G894 Chronic pain syndrome: Secondary | ICD-10-CM | POA: Diagnosis not present

## 2022-09-06 DIAGNOSIS — M545 Low back pain, unspecified: Secondary | ICD-10-CM | POA: Diagnosis not present

## 2022-10-28 DIAGNOSIS — Z01818 Encounter for other preprocedural examination: Secondary | ICD-10-CM | POA: Diagnosis not present

## 2023-03-24 DIAGNOSIS — M542 Cervicalgia: Secondary | ICD-10-CM | POA: Diagnosis not present

## 2023-03-24 DIAGNOSIS — F1320 Sedative, hypnotic or anxiolytic dependence, uncomplicated: Secondary | ICD-10-CM | POA: Diagnosis not present

## 2023-03-24 DIAGNOSIS — F319 Bipolar disorder, unspecified: Secondary | ICD-10-CM | POA: Diagnosis not present

## 2023-03-24 DIAGNOSIS — F515 Nightmare disorder: Secondary | ICD-10-CM | POA: Diagnosis not present

## 2023-03-24 DIAGNOSIS — Z6825 Body mass index (BMI) 25.0-25.9, adult: Secondary | ICD-10-CM | POA: Diagnosis not present

## 2023-03-24 DIAGNOSIS — F410 Panic disorder [episodic paroxysmal anxiety] without agoraphobia: Secondary | ICD-10-CM | POA: Diagnosis not present

## 2023-03-24 DIAGNOSIS — G8929 Other chronic pain: Secondary | ICD-10-CM | POA: Diagnosis not present

## 2023-03-24 DIAGNOSIS — Z79899 Other long term (current) drug therapy: Secondary | ICD-10-CM | POA: Diagnosis not present

## 2023-03-24 DIAGNOSIS — G4700 Insomnia, unspecified: Secondary | ICD-10-CM | POA: Diagnosis not present

## 2023-03-24 DIAGNOSIS — F411 Generalized anxiety disorder: Secondary | ICD-10-CM | POA: Diagnosis not present

## 2023-03-31 DIAGNOSIS — Z008 Encounter for other general examination: Secondary | ICD-10-CM | POA: Diagnosis not present

## 2023-03-31 DIAGNOSIS — E785 Hyperlipidemia, unspecified: Secondary | ICD-10-CM | POA: Diagnosis not present

## 2023-03-31 DIAGNOSIS — Z8249 Family history of ischemic heart disease and other diseases of the circulatory system: Secondary | ICD-10-CM | POA: Diagnosis not present

## 2023-03-31 DIAGNOSIS — F515 Nightmare disorder: Secondary | ICD-10-CM | POA: Diagnosis not present

## 2023-03-31 DIAGNOSIS — M1990 Unspecified osteoarthritis, unspecified site: Secondary | ICD-10-CM | POA: Diagnosis not present

## 2023-03-31 DIAGNOSIS — M810 Age-related osteoporosis without current pathological fracture: Secondary | ICD-10-CM | POA: Diagnosis not present

## 2023-03-31 DIAGNOSIS — J449 Chronic obstructive pulmonary disease, unspecified: Secondary | ICD-10-CM | POA: Diagnosis not present

## 2023-03-31 DIAGNOSIS — F840 Autistic disorder: Secondary | ICD-10-CM | POA: Diagnosis not present

## 2023-03-31 DIAGNOSIS — Z87891 Personal history of nicotine dependence: Secondary | ICD-10-CM | POA: Diagnosis not present

## 2023-03-31 DIAGNOSIS — F319 Bipolar disorder, unspecified: Secondary | ICD-10-CM | POA: Diagnosis not present

## 2023-03-31 DIAGNOSIS — R030 Elevated blood-pressure reading, without diagnosis of hypertension: Secondary | ICD-10-CM | POA: Diagnosis not present

## 2023-03-31 DIAGNOSIS — Z9181 History of falling: Secondary | ICD-10-CM | POA: Diagnosis not present

## 2023-03-31 DIAGNOSIS — R32 Unspecified urinary incontinence: Secondary | ICD-10-CM | POA: Diagnosis not present

## 2023-04-04 DIAGNOSIS — E785 Hyperlipidemia, unspecified: Secondary | ICD-10-CM | POA: Diagnosis not present

## 2023-04-22 DIAGNOSIS — Z1389 Encounter for screening for other disorder: Secondary | ICD-10-CM | POA: Diagnosis not present

## 2023-04-22 DIAGNOSIS — Z0000 Encounter for general adult medical examination without abnormal findings: Secondary | ICD-10-CM | POA: Diagnosis not present

## 2023-04-22 DIAGNOSIS — Z136 Encounter for screening for cardiovascular disorders: Secondary | ICD-10-CM | POA: Diagnosis not present

## 2023-04-22 DIAGNOSIS — Z1331 Encounter for screening for depression: Secondary | ICD-10-CM | POA: Diagnosis not present

## 2023-04-22 DIAGNOSIS — Z1339 Encounter for screening examination for other mental health and behavioral disorders: Secondary | ICD-10-CM | POA: Diagnosis not present

## 2023-04-22 DIAGNOSIS — Z139 Encounter for screening, unspecified: Secondary | ICD-10-CM | POA: Diagnosis not present

## 2023-04-22 DIAGNOSIS — Z6827 Body mass index (BMI) 27.0-27.9, adult: Secondary | ICD-10-CM | POA: Diagnosis not present

## 2023-04-22 DIAGNOSIS — Z1231 Encounter for screening mammogram for malignant neoplasm of breast: Secondary | ICD-10-CM | POA: Diagnosis not present

## 2023-04-22 DIAGNOSIS — R1310 Dysphagia, unspecified: Secondary | ICD-10-CM | POA: Diagnosis not present

## 2023-04-23 DIAGNOSIS — G8929 Other chronic pain: Secondary | ICD-10-CM | POA: Diagnosis not present

## 2023-04-23 DIAGNOSIS — F319 Bipolar disorder, unspecified: Secondary | ICD-10-CM | POA: Diagnosis not present

## 2023-04-23 DIAGNOSIS — F1320 Sedative, hypnotic or anxiolytic dependence, uncomplicated: Secondary | ICD-10-CM | POA: Diagnosis not present

## 2023-04-23 DIAGNOSIS — F515 Nightmare disorder: Secondary | ICD-10-CM | POA: Diagnosis not present

## 2023-04-23 DIAGNOSIS — M542 Cervicalgia: Secondary | ICD-10-CM | POA: Diagnosis not present

## 2023-04-23 DIAGNOSIS — Z6825 Body mass index (BMI) 25.0-25.9, adult: Secondary | ICD-10-CM | POA: Diagnosis not present

## 2023-04-23 DIAGNOSIS — G4700 Insomnia, unspecified: Secondary | ICD-10-CM | POA: Diagnosis not present

## 2023-04-23 DIAGNOSIS — F411 Generalized anxiety disorder: Secondary | ICD-10-CM | POA: Diagnosis not present

## 2023-04-23 DIAGNOSIS — F410 Panic disorder [episodic paroxysmal anxiety] without agoraphobia: Secondary | ICD-10-CM | POA: Diagnosis not present

## 2023-04-23 DIAGNOSIS — Z79899 Other long term (current) drug therapy: Secondary | ICD-10-CM | POA: Diagnosis not present

## 2023-05-02 DIAGNOSIS — R4182 Altered mental status, unspecified: Secondary | ICD-10-CM | POA: Diagnosis not present

## 2023-05-02 DIAGNOSIS — R410 Disorientation, unspecified: Secondary | ICD-10-CM | POA: Diagnosis not present

## 2023-05-02 DIAGNOSIS — Z1152 Encounter for screening for COVID-19: Secondary | ICD-10-CM | POA: Diagnosis not present

## 2023-05-02 DIAGNOSIS — Z79899 Other long term (current) drug therapy: Secondary | ICD-10-CM | POA: Diagnosis not present

## 2023-05-02 DIAGNOSIS — F1190 Opioid use, unspecified, uncomplicated: Secondary | ICD-10-CM | POA: Diagnosis not present

## 2023-05-02 DIAGNOSIS — Z743 Need for continuous supervision: Secondary | ICD-10-CM | POA: Diagnosis not present

## 2023-05-02 DIAGNOSIS — F419 Anxiety disorder, unspecified: Secondary | ICD-10-CM | POA: Diagnosis not present

## 2023-05-02 DIAGNOSIS — F319 Bipolar disorder, unspecified: Secondary | ICD-10-CM | POA: Diagnosis not present

## 2023-05-08 ENCOUNTER — Other Ambulatory Visit: Payer: Self-pay | Admitting: Physician Assistant

## 2023-05-08 DIAGNOSIS — Z1231 Encounter for screening mammogram for malignant neoplasm of breast: Secondary | ICD-10-CM

## 2023-05-14 ENCOUNTER — Telehealth: Payer: Self-pay

## 2023-05-14 NOTE — Telephone Encounter (Signed)
Transition Care Management Unsuccessful Follow-up Telephone Call  Date of discharge and from where:  05/02/2023 [facility]  Attempts:  1st Attempt  Reason for unsuccessful TCM follow-up call:  No answer/busy  Alexica Schlossberg [facility]  [facility] Population Health Community Resource Care Guide   [email or web address]  ?? [phone]   Website: [email or web address]  [email or web address]

## 2023-05-15 ENCOUNTER — Telehealth: Payer: Self-pay

## 2023-05-15 NOTE — Telephone Encounter (Signed)
Transition Care Management Unsuccessful Follow-up Telephone Call  Date of discharge and from where:  05/02/2023 [facility]  Attempts:  2nd Attempt  Reason for unsuccessful TCM follow-up call:  Left voice message  Marilyn Lambert [facility]  [facility] Population Health Community Resource Care Guide   [email or web address]  ?? [phone]   Website: [email or web address]  [email or web address]

## 2023-05-28 ENCOUNTER — Inpatient Hospital Stay: Admission: RE | Admit: 2023-05-28 | Payer: 59 | Source: Ambulatory Visit

## 2023-10-27 DIAGNOSIS — Z720 Tobacco use: Secondary | ICD-10-CM | POA: Diagnosis not present

## 2023-10-27 DIAGNOSIS — R55 Syncope and collapse: Secondary | ICD-10-CM | POA: Diagnosis not present

## 2023-10-27 DIAGNOSIS — K219 Gastro-esophageal reflux disease without esophagitis: Secondary | ICD-10-CM | POA: Diagnosis not present

## 2023-10-27 DIAGNOSIS — E876 Hypokalemia: Secondary | ICD-10-CM | POA: Diagnosis not present

## 2023-10-27 DIAGNOSIS — I10 Essential (primary) hypertension: Secondary | ICD-10-CM | POA: Diagnosis not present

## 2023-10-27 DIAGNOSIS — J440 Chronic obstructive pulmonary disease with acute lower respiratory infection: Secondary | ICD-10-CM | POA: Diagnosis not present

## 2023-10-27 DIAGNOSIS — Z8679 Personal history of other diseases of the circulatory system: Secondary | ICD-10-CM | POA: Diagnosis not present

## 2023-10-27 DIAGNOSIS — R059 Cough, unspecified: Secondary | ICD-10-CM | POA: Diagnosis not present

## 2023-10-27 DIAGNOSIS — Z87891 Personal history of nicotine dependence: Secondary | ICD-10-CM | POA: Diagnosis not present

## 2023-10-27 DIAGNOSIS — R0602 Shortness of breath: Secondary | ICD-10-CM | POA: Diagnosis not present

## 2023-10-27 DIAGNOSIS — Z79899 Other long term (current) drug therapy: Secondary | ICD-10-CM | POA: Diagnosis not present

## 2023-10-27 DIAGNOSIS — Z20822 Contact with and (suspected) exposure to covid-19: Secondary | ICD-10-CM | POA: Diagnosis not present

## 2023-10-27 DIAGNOSIS — F17290 Nicotine dependence, other tobacco product, uncomplicated: Secondary | ICD-10-CM | POA: Diagnosis not present

## 2023-10-27 DIAGNOSIS — R0902 Hypoxemia: Secondary | ICD-10-CM | POA: Diagnosis not present

## 2023-10-28 DIAGNOSIS — R55 Syncope and collapse: Secondary | ICD-10-CM | POA: Diagnosis not present

## 2023-10-28 DIAGNOSIS — R059 Cough, unspecified: Secondary | ICD-10-CM | POA: Diagnosis not present

## 2023-10-28 DIAGNOSIS — R0602 Shortness of breath: Secondary | ICD-10-CM | POA: Diagnosis not present

## 2023-10-28 DIAGNOSIS — J440 Chronic obstructive pulmonary disease with acute lower respiratory infection: Secondary | ICD-10-CM | POA: Diagnosis not present

## 2023-10-29 DIAGNOSIS — R55 Syncope and collapse: Secondary | ICD-10-CM | POA: Diagnosis not present

## 2023-10-29 DIAGNOSIS — J440 Chronic obstructive pulmonary disease with acute lower respiratory infection: Secondary | ICD-10-CM | POA: Diagnosis not present

## 2023-11-22 DIAGNOSIS — I959 Hypotension, unspecified: Secondary | ICD-10-CM | POA: Diagnosis not present

## 2023-11-22 DIAGNOSIS — F17290 Nicotine dependence, other tobacco product, uncomplicated: Secondary | ICD-10-CM | POA: Diagnosis not present

## 2023-11-22 DIAGNOSIS — R069 Unspecified abnormalities of breathing: Secondary | ICD-10-CM | POA: Diagnosis not present

## 2023-11-22 DIAGNOSIS — J9601 Acute respiratory failure with hypoxia: Secondary | ICD-10-CM | POA: Diagnosis not present

## 2023-11-22 DIAGNOSIS — R531 Weakness: Secondary | ICD-10-CM | POA: Diagnosis not present

## 2023-11-22 DIAGNOSIS — I10 Essential (primary) hypertension: Secondary | ICD-10-CM | POA: Diagnosis not present

## 2023-11-22 DIAGNOSIS — J209 Acute bronchitis, unspecified: Secondary | ICD-10-CM | POA: Diagnosis not present

## 2023-11-22 DIAGNOSIS — Z20822 Contact with and (suspected) exposure to covid-19: Secondary | ICD-10-CM | POA: Diagnosis not present

## 2023-11-22 DIAGNOSIS — J441 Chronic obstructive pulmonary disease with (acute) exacerbation: Secondary | ICD-10-CM | POA: Diagnosis not present

## 2023-11-22 DIAGNOSIS — R0902 Hypoxemia: Secondary | ICD-10-CM | POA: Diagnosis not present

## 2023-11-22 DIAGNOSIS — R000 Tachycardia, unspecified: Secondary | ICD-10-CM | POA: Diagnosis not present

## 2023-11-22 DIAGNOSIS — R9431 Abnormal electrocardiogram [ECG] [EKG]: Secondary | ICD-10-CM | POA: Diagnosis not present

## 2023-11-22 DIAGNOSIS — Z79899 Other long term (current) drug therapy: Secondary | ICD-10-CM | POA: Diagnosis not present

## 2023-11-22 DIAGNOSIS — R059 Cough, unspecified: Secondary | ICD-10-CM | POA: Diagnosis not present

## 2023-11-22 DIAGNOSIS — K219 Gastro-esophageal reflux disease without esophagitis: Secondary | ICD-10-CM | POA: Diagnosis not present

## 2023-11-23 DIAGNOSIS — J441 Chronic obstructive pulmonary disease with (acute) exacerbation: Secondary | ICD-10-CM | POA: Diagnosis not present

## 2023-11-23 DIAGNOSIS — J9601 Acute respiratory failure with hypoxia: Secondary | ICD-10-CM | POA: Diagnosis not present

## 2023-11-24 DIAGNOSIS — J9601 Acute respiratory failure with hypoxia: Secondary | ICD-10-CM | POA: Diagnosis not present

## 2023-11-24 DIAGNOSIS — J441 Chronic obstructive pulmonary disease with (acute) exacerbation: Secondary | ICD-10-CM | POA: Diagnosis not present

## 2024-01-22 DIAGNOSIS — Z1211 Encounter for screening for malignant neoplasm of colon: Secondary | ICD-10-CM | POA: Diagnosis not present

## 2024-01-22 DIAGNOSIS — Z1231 Encounter for screening mammogram for malignant neoplasm of breast: Secondary | ICD-10-CM | POA: Diagnosis not present

## 2024-01-22 DIAGNOSIS — E785 Hyperlipidemia, unspecified: Secondary | ICD-10-CM | POA: Diagnosis not present

## 2024-01-23 ENCOUNTER — Other Ambulatory Visit (HOSPITAL_BASED_OUTPATIENT_CLINIC_OR_DEPARTMENT_OTHER): Payer: Self-pay | Admitting: Physician Assistant

## 2024-01-23 DIAGNOSIS — Z1231 Encounter for screening mammogram for malignant neoplasm of breast: Secondary | ICD-10-CM

## 2024-02-06 ENCOUNTER — Encounter (HOSPITAL_BASED_OUTPATIENT_CLINIC_OR_DEPARTMENT_OTHER): Payer: Self-pay

## 2024-06-10 DIAGNOSIS — J9811 Atelectasis: Secondary | ICD-10-CM | POA: Diagnosis not present

## 2024-06-10 DIAGNOSIS — Z87891 Personal history of nicotine dependence: Secondary | ICD-10-CM | POA: Diagnosis not present

## 2024-06-10 DIAGNOSIS — Z79899 Other long term (current) drug therapy: Secondary | ICD-10-CM | POA: Diagnosis not present

## 2024-06-10 DIAGNOSIS — Z7952 Long term (current) use of systemic steroids: Secondary | ICD-10-CM | POA: Diagnosis not present

## 2024-06-10 DIAGNOSIS — J441 Chronic obstructive pulmonary disease with (acute) exacerbation: Secondary | ICD-10-CM | POA: Diagnosis not present

## 2024-06-10 DIAGNOSIS — I10 Essential (primary) hypertension: Secondary | ICD-10-CM | POA: Diagnosis not present

## 2024-06-28 DIAGNOSIS — S93402A Sprain of unspecified ligament of left ankle, initial encounter: Secondary | ICD-10-CM | POA: Diagnosis not present

## 2024-06-29 DIAGNOSIS — M7989 Other specified soft tissue disorders: Secondary | ICD-10-CM | POA: Diagnosis not present
